# Patient Record
Sex: Female | Born: 1970 | Race: White | Hispanic: No | Marital: Married | Smoking: Never smoker
Health system: Southern US, Community
[De-identification: ages and names within clinical notes are randomized; demographics above are authoritative.]

---

## 2001-03-29 ENCOUNTER — Emergency Department (HOSPITAL_COMMUNITY): Admission: EM | Admit: 2001-03-29 | Discharge: 2001-03-29 | Payer: Self-pay | Admitting: Orthopedic Surgery

## 2003-07-10 ENCOUNTER — Inpatient Hospital Stay (HOSPITAL_COMMUNITY): Admission: AD | Admit: 2003-07-10 | Discharge: 2003-07-10 | Payer: Self-pay | Admitting: *Deleted

## 2003-07-10 ENCOUNTER — Encounter: Payer: Self-pay | Admitting: Obstetrics and Gynecology

## 2003-07-11 ENCOUNTER — Ambulatory Visit (HOSPITAL_COMMUNITY): Admission: AD | Admit: 2003-07-11 | Discharge: 2003-07-12 | Payer: Self-pay | Admitting: *Deleted

## 2003-07-12 ENCOUNTER — Encounter (INDEPENDENT_AMBULATORY_CARE_PROVIDER_SITE_OTHER): Payer: Self-pay | Admitting: Specialist

## 2004-03-05 ENCOUNTER — Ambulatory Visit (HOSPITAL_COMMUNITY): Admission: RE | Admit: 2004-03-05 | Discharge: 2004-03-05 | Payer: Self-pay | Admitting: *Deleted

## 2004-04-20 ENCOUNTER — Inpatient Hospital Stay (HOSPITAL_COMMUNITY): Admission: AD | Admit: 2004-04-20 | Discharge: 2004-04-21 | Payer: Self-pay | Admitting: *Deleted

## 2004-05-21 ENCOUNTER — Ambulatory Visit (HOSPITAL_COMMUNITY): Admission: RE | Admit: 2004-05-21 | Discharge: 2004-05-21 | Payer: Self-pay | Admitting: *Deleted

## 2004-06-04 ENCOUNTER — Ambulatory Visit (HOSPITAL_COMMUNITY): Admission: RE | Admit: 2004-06-04 | Discharge: 2004-06-04 | Payer: Self-pay | Admitting: *Deleted

## 2004-08-06 ENCOUNTER — Inpatient Hospital Stay (HOSPITAL_COMMUNITY): Admission: AD | Admit: 2004-08-06 | Discharge: 2004-08-06 | Payer: Self-pay | Admitting: *Deleted

## 2004-08-06 ENCOUNTER — Ambulatory Visit: Payer: Self-pay | Admitting: Family Medicine

## 2004-09-16 ENCOUNTER — Inpatient Hospital Stay (HOSPITAL_COMMUNITY): Admission: AD | Admit: 2004-09-16 | Discharge: 2004-09-16 | Payer: Self-pay | Admitting: Family Medicine

## 2004-10-17 ENCOUNTER — Inpatient Hospital Stay (HOSPITAL_COMMUNITY): Admission: AD | Admit: 2004-10-17 | Discharge: 2004-10-18 | Payer: Self-pay | Admitting: Obstetrics & Gynecology

## 2004-10-21 ENCOUNTER — Ambulatory Visit (HOSPITAL_COMMUNITY): Admission: RE | Admit: 2004-10-21 | Discharge: 2004-10-21 | Payer: Self-pay | Admitting: Family Medicine

## 2004-10-21 ENCOUNTER — Ambulatory Visit: Payer: Self-pay | Admitting: Obstetrics & Gynecology

## 2004-10-26 ENCOUNTER — Ambulatory Visit: Payer: Self-pay | Admitting: *Deleted

## 2004-10-26 ENCOUNTER — Inpatient Hospital Stay (HOSPITAL_COMMUNITY): Admission: AD | Admit: 2004-10-26 | Discharge: 2004-10-28 | Payer: Self-pay | Admitting: *Deleted

## 2004-10-28 IMAGING — US US OB COMP +14 WK
1 series · 5 of 5 positions shown · non-contrast
Comparison: none

CLINICAL DATA: G5 P2 AB2.  LMP 01/11/04.  Assess fetal anatomy.

[Series 1: unknown · 0.22mm/px · 5 of 5 slices shown]
[im 1/5]
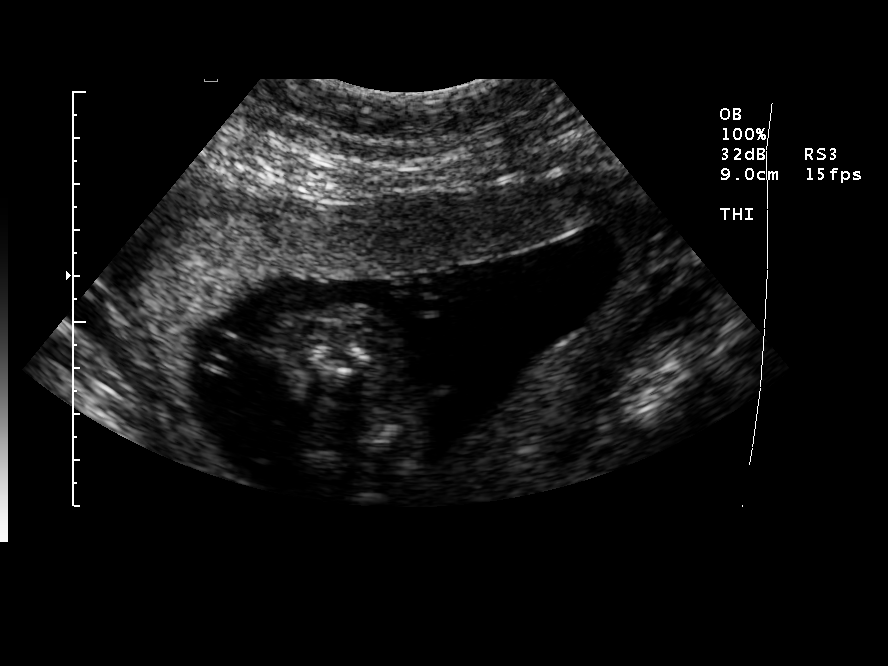
[im 2/5]
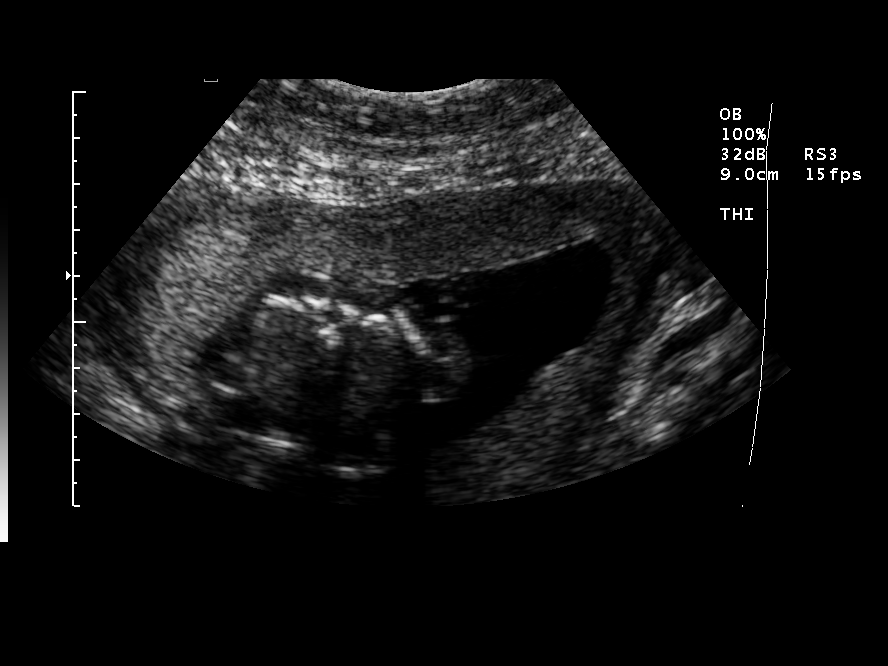
[im 3/5]
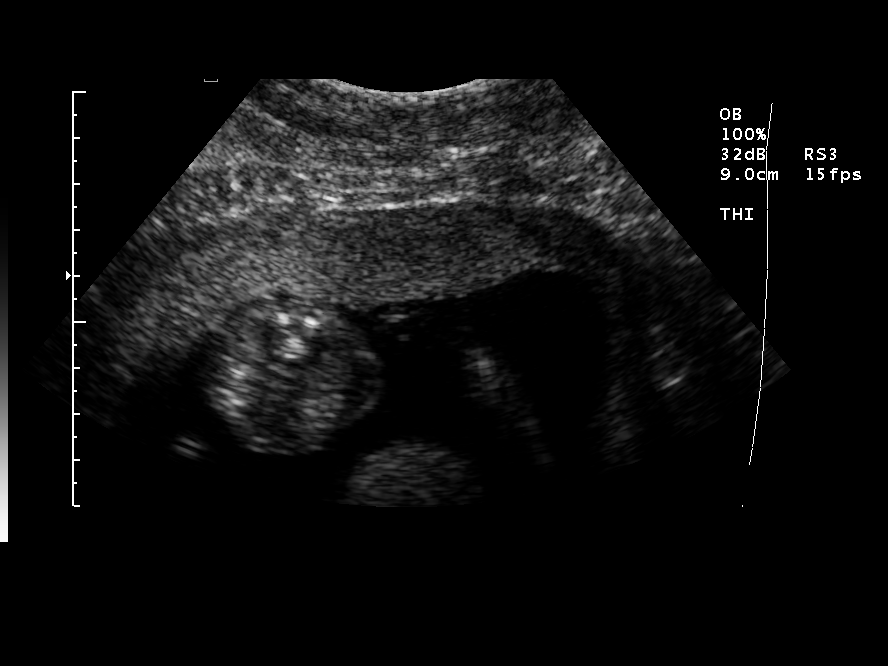
[im 4/5]
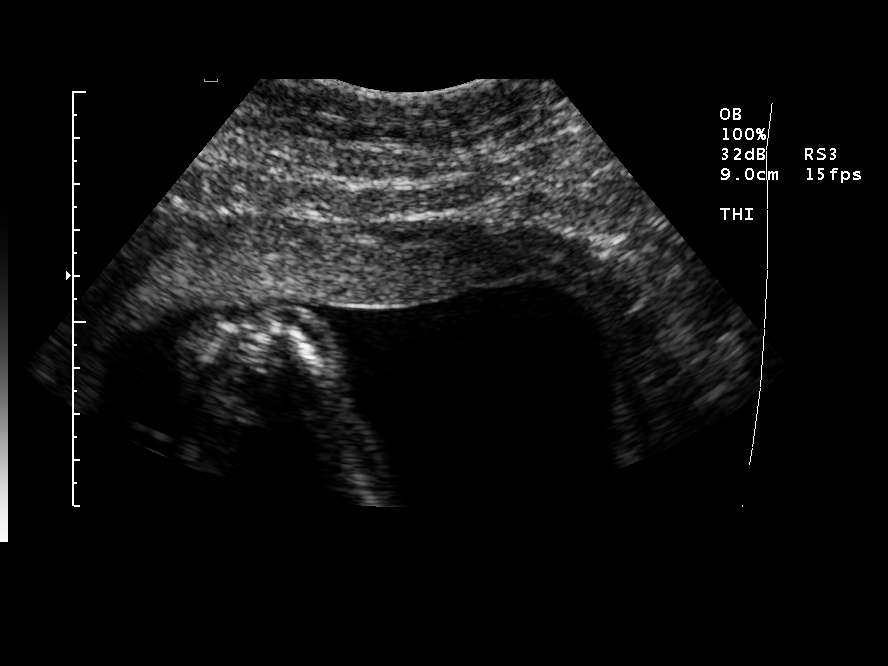
[im 5/5]
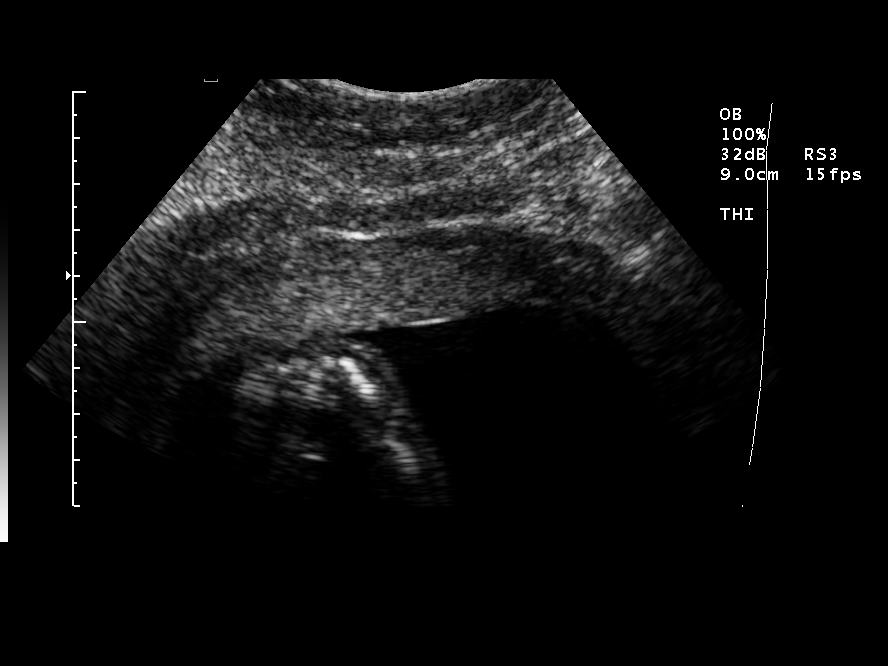

[5 of 5 positions shown; findings below may reference images not displayed]

OBSTETRICAL ULTRASOUND
 Number of Fetuses:  1
 Heart Rate:  145
 Movement:  Yes
 Breathing:  No  
 Presentation:  Transverse with head on maternal left
 Placental Location:  Anterior
 Grade:  I
 Previa:  No
 Amniotic Fluid (Subjective):  Normal
 Amniotic Fluid (Objective):   5.8 cm Vertical pocket 

 FETAL BIOMETRY
 BPD:   4.2 cm   18 w 5 d
 HC:   16.3 cm   19 w 1 d
 AC:   13.6 cm   19 w 0 d
 FL:    3.0 cm   19 w 1 d

 MEAN GA:  19 w 0 d
 GA BY LMP:  18 w 5 d (assigned)

 FETAL ANATOMY
 Lateral Ventricles:    Visualized 
 Thalami/CSP:      Visualized 
 Posterior Fossa:  Visualized 
 Nuchal Region:    Visualized 
 Spine:      Visualized 
 4 Chamber Heart on Left:      Not visualized 
 Stomach on Left:      Visualized 
 3 Vessel Cord:    Visualized 
 Cord Insertion site:    Visualized 
 Kidneys:  Visualized 
 Bladder:  Visualized 
 Extremities:      Visualized 

 ADDITIONAL ANATOMY VISUALIZED:  LVOT, orbits, diaphragm, heel, 5th digit, ductal arch, aortic arch, and female genitalia

 Evaluation limited by:  Fetal position

 MATERNAL FINDINGS
 Cervix: 3.3 cm Transabdominally
IMPRESSION: Single living intrauterine fetus in transverse lie with head on maternal left.  Patient is 18 weeks 5 days by LMP dating and measures 19 weeks today indicating appropriate growth.  
 Four chambered heart, RVOT, upper lip and profile not seen due to fetal positioning.  Otherwise, no anatomic abnormality noted.

 </u12:p>

## 2005-01-09 ENCOUNTER — Emergency Department (HOSPITAL_COMMUNITY): Admission: EM | Admit: 2005-01-09 | Discharge: 2005-01-09 | Payer: Self-pay | Admitting: Emergency Medicine

## 2005-03-30 IMAGING — US US FETAL BPP W/O NONSTRESS
1 series · 14 of 18 positions shown · non-contrast
Comparison: none

CLINICAL DATA: Postdates.  Assess fetal well-being and amniotic fluid volume.

[Series 1: us fetal bpp w/o nonstress · 0.43mm/px · 14 of 18 slices shown]
[im 1/18]
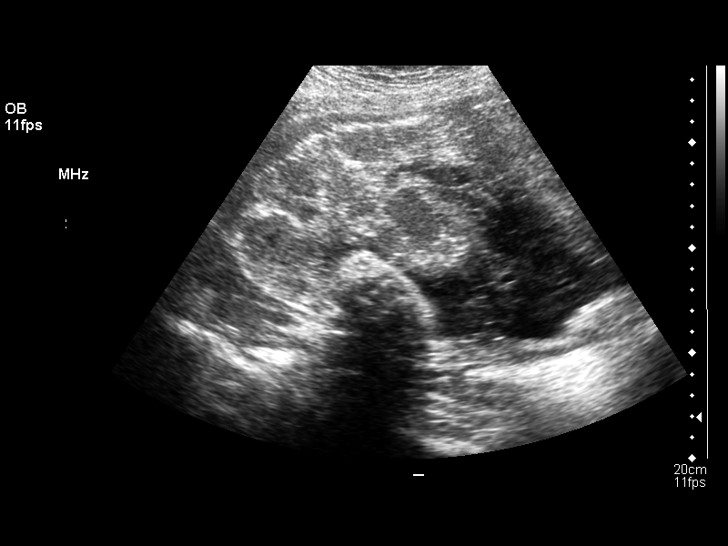
[im 2/18]
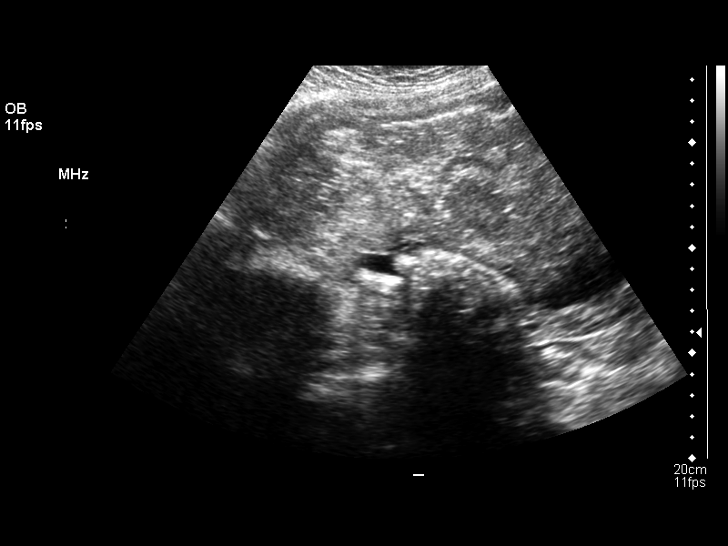
[im 4/18]
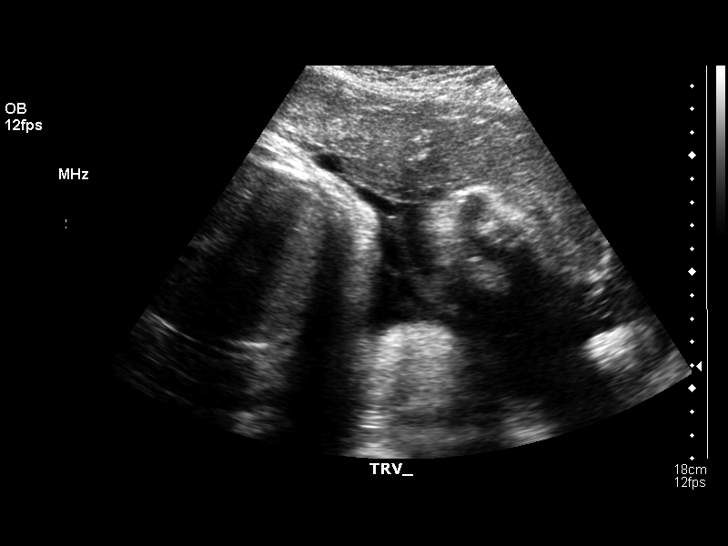
[im 5/18]
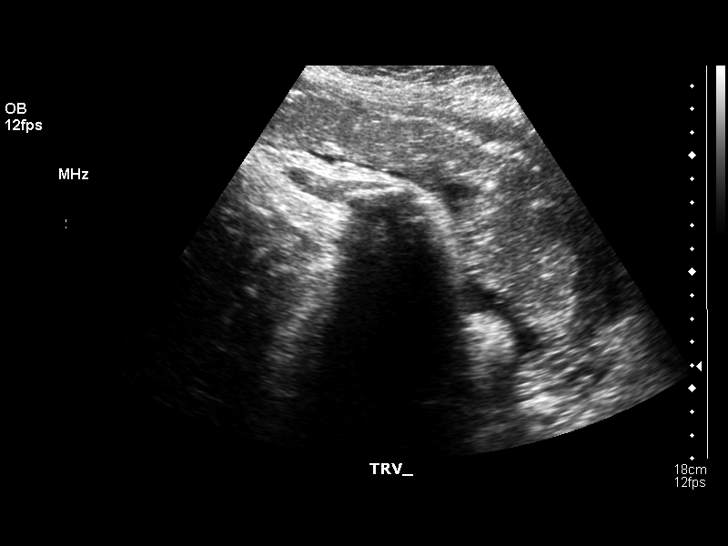
[im 6/18]
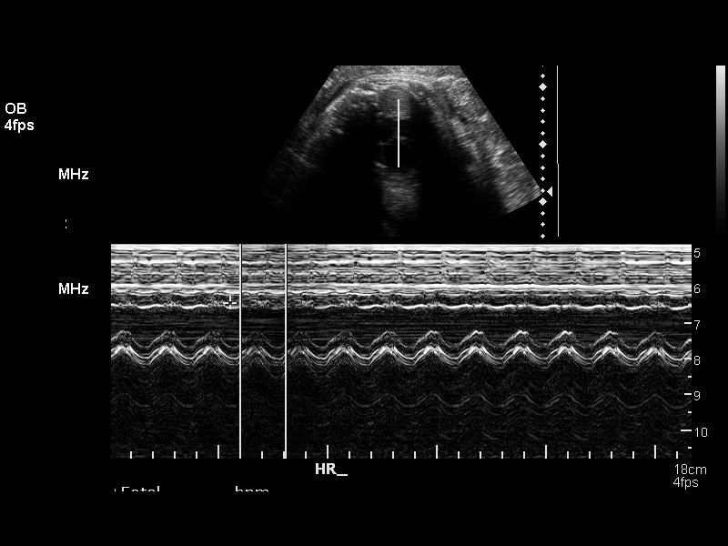
[im 8/18]
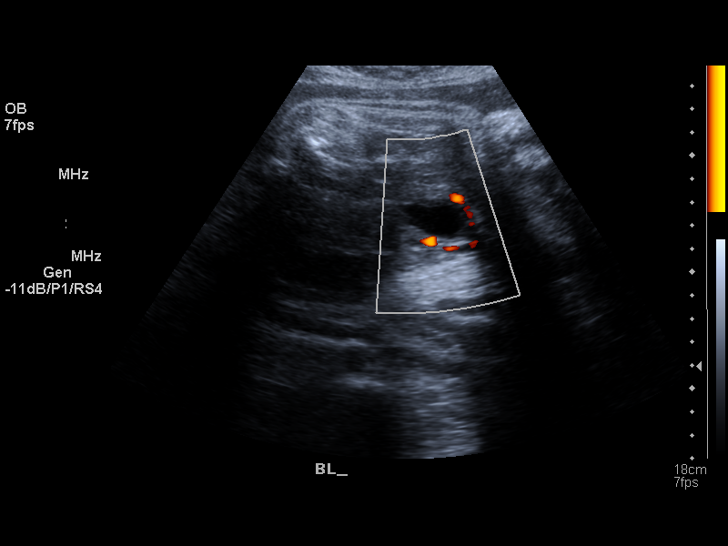
[im 9/18]
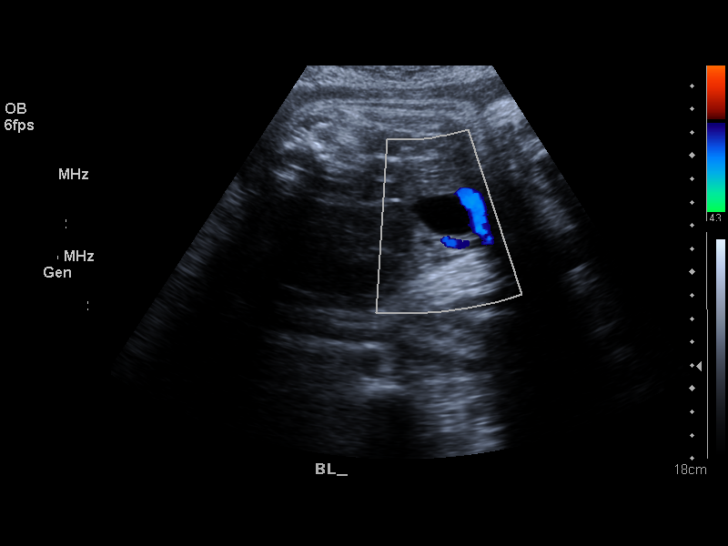
[im 10/18]
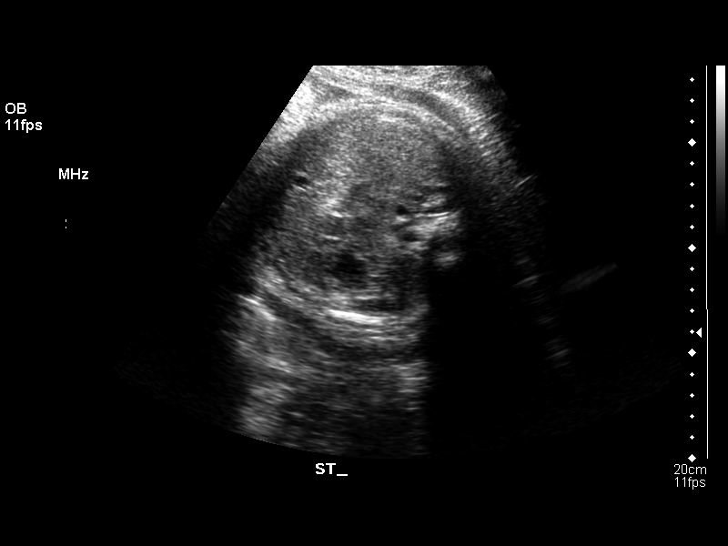
[im 11/18]
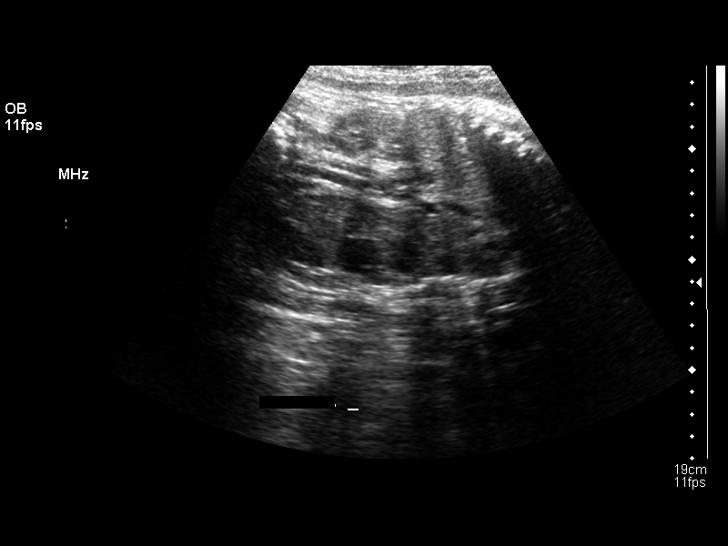
[im 13/18]
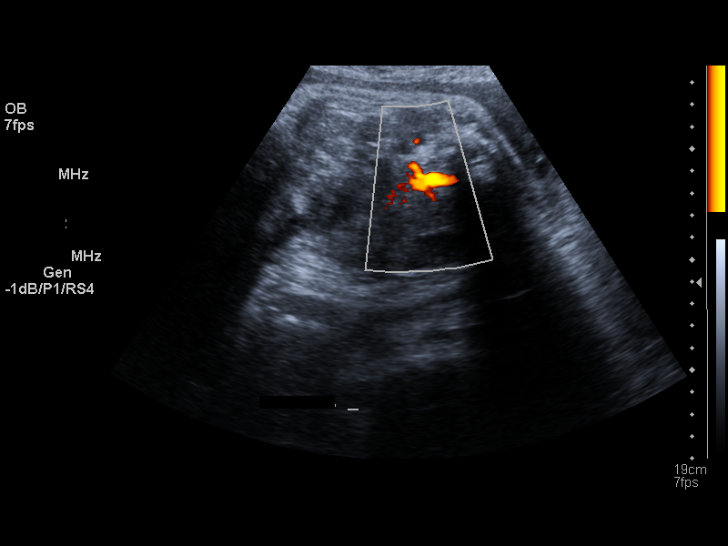
[im 14/18]
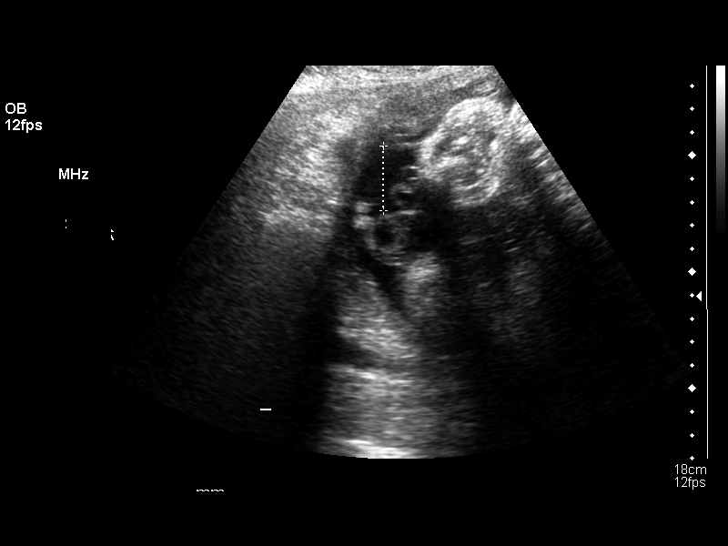
[im 15/18]
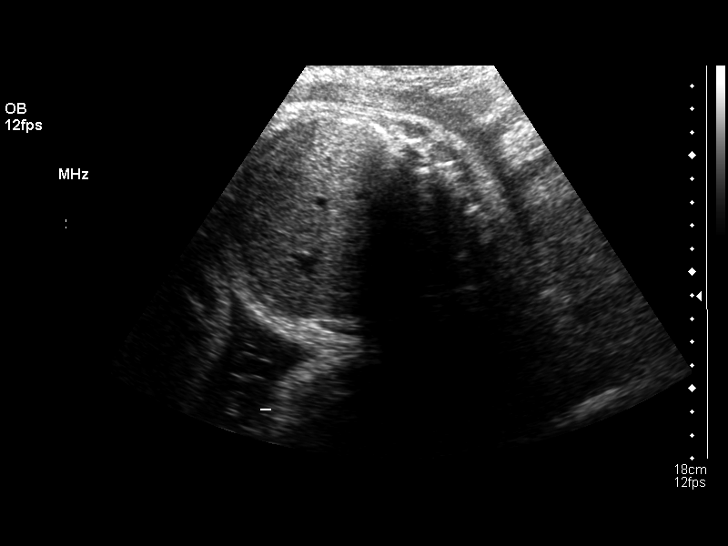
[im 17/18]
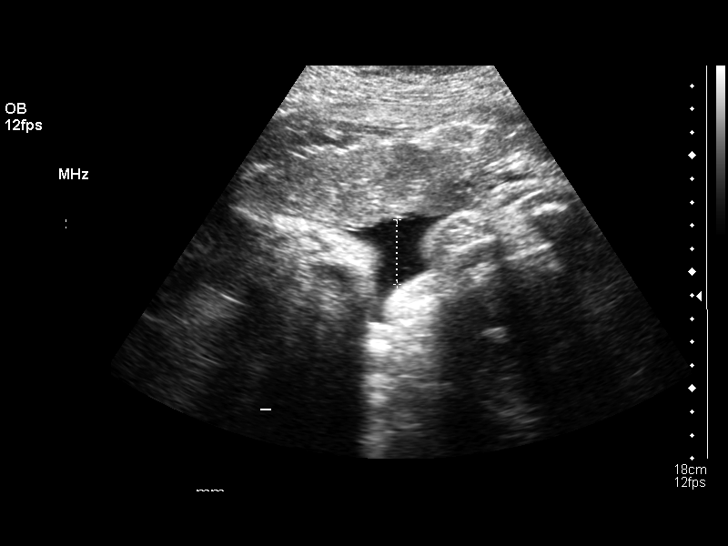
[im 18/18]
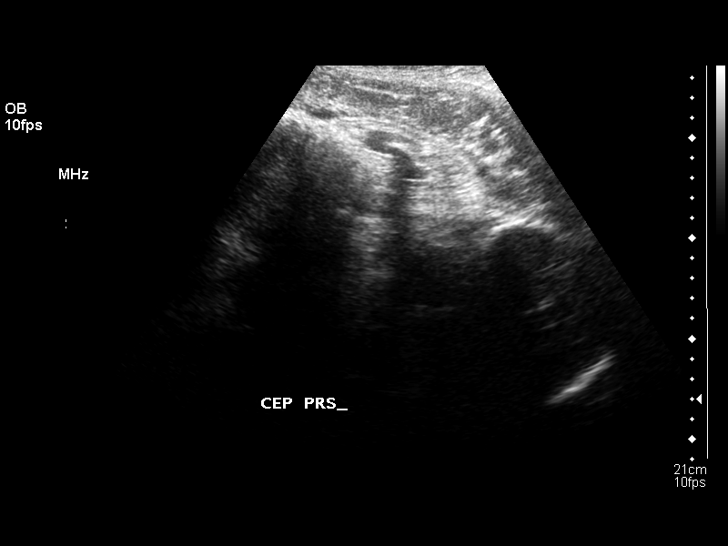

[14 of 18 positions shown; findings below may reference images not displayed]

LIMITED OBSTETRICAL ULTRASOUND:
Number of Fetuses:  1
Heart Rate:  144
Movement:  Yes
Breathing:  Yes
Presentation:  Cephalic
Placental Location:  Anterior
Grade:  III
Previa:  No    Amniotic Fluid (Subjective):  Normal
Amniotic Fluid (Objective):  10.6 cm AFI (5th -95th%ile = 7.0 ? 19.4 cm for 41 wks)

Fetal measurements and complete anatomic evaluation were not requested.  The following fetal anatomy was visualized during this exam:  Stomach, kidneys, bladder.

MATERNAL FINDINGS 
Cervix:  Not evaluated

BIOPHYSICAL PROFILE

Movement:  2    Time:  2
Breathing:  2
Tone:  2
Amniotic Fluid:  2

Total Score:  8
IMPRESSION: Biophysical profile score [DATE].
Subjectively and quantitatively normal amniotic fluid volume.
No late developing fetal anatomic abnormalities are identified associated with the stomach, kidneys or bladder.  A four chamber heart view and lateral ventricles could not be seen with confidence due to positioning on today?s exam.
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
## 2006-09-11 ENCOUNTER — Emergency Department: Payer: Self-pay | Admitting: Unknown Physician Specialty

## 2010-02-26 ENCOUNTER — Ambulatory Visit (HOSPITAL_COMMUNITY): Admission: RE | Admit: 2010-02-26 | Discharge: 2010-02-26 | Payer: Self-pay | Admitting: Family Medicine

## 2010-03-18 ENCOUNTER — Ambulatory Visit: Payer: Self-pay | Admitting: Obstetrics and Gynecology

## 2010-03-18 ENCOUNTER — Inpatient Hospital Stay (HOSPITAL_COMMUNITY): Admission: AD | Admit: 2010-03-18 | Discharge: 2010-03-18 | Payer: Self-pay | Admitting: Obstetrics and Gynecology

## 2010-04-27 ENCOUNTER — Inpatient Hospital Stay (HOSPITAL_COMMUNITY): Admission: AD | Admit: 2010-04-27 | Discharge: 2010-04-27 | Payer: Self-pay | Admitting: Obstetrics and Gynecology

## 2010-04-27 ENCOUNTER — Ambulatory Visit: Payer: Self-pay | Admitting: Advanced Practice Midwife

## 2010-06-26 ENCOUNTER — Ambulatory Visit: Payer: Self-pay | Admitting: Obstetrics and Gynecology

## 2010-06-26 ENCOUNTER — Inpatient Hospital Stay (HOSPITAL_COMMUNITY): Admission: AD | Admit: 2010-06-26 | Discharge: 2010-06-26 | Payer: Self-pay | Admitting: Obstetrics & Gynecology

## 2010-07-23 ENCOUNTER — Ambulatory Visit: Payer: Self-pay | Admitting: Family Medicine

## 2010-07-27 ENCOUNTER — Ambulatory Visit: Payer: Self-pay | Admitting: Obstetrics & Gynecology

## 2010-07-27 ENCOUNTER — Inpatient Hospital Stay (HOSPITAL_COMMUNITY): Admission: AD | Admit: 2010-07-27 | Discharge: 2010-07-28 | Payer: Self-pay | Admitting: Obstetrics & Gynecology

## 2011-02-20 LAB — CBC
MCH: 30.1 pg (ref 26.0–34.0)
MCHC: 34.6 g/dL (ref 30.0–36.0)
MCV: 86.9 fL (ref 78.0–100.0)
Platelets: 194 10*3/uL (ref 150–400)
RDW: 13.4 % (ref 11.5–15.5)

## 2011-02-22 LAB — COMPREHENSIVE METABOLIC PANEL
AST: 14 U/L (ref 0–37)
Albumin: 2.6 g/dL — ABNORMAL LOW (ref 3.5–5.2)
BUN: 7 mg/dL (ref 6–23)
Calcium: 9.3 mg/dL (ref 8.4–10.5)
Creatinine, Ser: 0.74 mg/dL (ref 0.4–1.2)
GFR calc Af Amer: >60 mL/min (ref >60)
GFR calc non Af Amer: >60 mL/min (ref >60)

## 2011-02-22 LAB — CBC
MCH: 31.3 pg (ref 26.0–34.0)
MCHC: 35 g/dL (ref 30.0–36.0)
MCV: 89.2 fL (ref 78.0–100.0)
Platelets: 242 10*3/uL (ref 150–400)

## 2011-02-22 LAB — URIC ACID: Uric Acid, Serum: 6.3 mg/dL (ref 2.4–7.0)

## 2011-02-24 LAB — URINE MICROSCOPIC-ADD ON

## 2011-02-24 LAB — WET PREP, GENITAL: Clue Cells Wet Prep HPF POC: NONE SEEN

## 2011-02-24 LAB — URINALYSIS, ROUTINE W REFLEX MICROSCOPIC
Glucose, UA: NEGATIVE mg/dL
Nitrite: NEGATIVE
Protein, ur: NEGATIVE mg/dL
pH: 7 (ref 5.0–8.0)

## 2011-04-25 NOTE — Op Note (Signed)
   Danielle Massey, Danielle Massey                        ACCOUNT NO.:  [id number]   MEDICAL RECORD NO.:  [id number]                   PATIENT TYPE:  MAT   LOCATION:  [facility]                                 FACILITY:  [facility]   PHYSICIAN:  Conni Elliot, M.D.             DATE OF BIRTH:  08-Aug-1971   DATE OF PROCEDURE:  07/12/2003  DATE OF DISCHARGE:                                 OPERATIVE REPORT   PREOPERATIVE DIAGNOSIS:  Incomplete abortion.   POSTOPERATIVE DIAGNOSIS:  Incomplete abortion.   OPERATION:  Dilatation and evacuation.   SURGEON:  Conni Elliot, M.D.   ANESTHESIA:  MAC.   DESCRIPTION OF PROCEDURE:  The patient was placed in the supine dorsal  lithotomy position.  The perineum was prepped and draped in a sterile  fashion.  A weighted speculum was placed across the vagina, the anterior  cervix was grasped with a single-tooth tenaculum.  There were products of  conception in the os.  This was removed with sponge stick.  The uterus was  evacuated a number of times with suction and followed by sharp curettage to  identify completeness.  Estimated blood loss was less than 100 mL.  Needle  and sponge count correct.                                               Conni Elliot, M.D.    ASG/MEDQ  D:  07/12/2003  T:  07/12/2003  Job:  [id number]

## 2016-01-23 ENCOUNTER — Other Ambulatory Visit: Payer: Self-pay | Admitting: Internal Medicine

## 2016-01-23 DIAGNOSIS — Z1231 Encounter for screening mammogram for malignant neoplasm of breast: Secondary | ICD-10-CM

## 2016-03-12 ENCOUNTER — Ambulatory Visit: Payer: Self-pay

## 2016-03-26 ENCOUNTER — Ambulatory Visit: Payer: Self-pay | Attending: Internal Medicine

## 2019-02-10 ENCOUNTER — Encounter: Payer: Self-pay | Admitting: Emergency Medicine

## 2019-02-10 ENCOUNTER — Other Ambulatory Visit: Payer: Self-pay

## 2019-02-10 ENCOUNTER — Emergency Department
Admission: EM | Admit: 2019-02-10 | Discharge: 2019-02-10 | Disposition: A | Discharge status: Home / Self Care | Payer: No Typology Code available for payment source | Attending: Emergency Medicine | Admitting: Emergency Medicine

## 2019-02-10 DIAGNOSIS — Z5321 Procedure and treatment not carried out due to patient leaving prior to being seen by health care provider: Secondary | ICD-10-CM | POA: Diagnosis not present

## 2019-02-10 DIAGNOSIS — R51 Headache: Secondary | ICD-10-CM | POA: Insufficient documentation

## 2019-02-10 NOTE — ED Triage Notes (Signed)
Pt states headache since October, states she dx w/ ear infection at that time , states she started prednisone x2wks ago and has not helped. Has appt with ENT next week but can't get pain relief at home. Pt tearful in traige.

## 2025-03-22 ENCOUNTER — Encounter: Payer: PRIVATE HEALTH INSURANCE | Admitting: Obstetrics & Gynecology
